# Patient Record
Sex: Female | Born: 1950 | Hispanic: No | Marital: Single | Smoking: Never smoker
Health system: Southern US, Community
[De-identification: ages and names within clinical notes are randomized; demographics above are authoritative.]

## PROBLEM LIST (undated history)

## (undated) DIAGNOSIS — I10 Essential (primary) hypertension: Secondary | ICD-10-CM

## (undated) DIAGNOSIS — I639 Cerebral infarction, unspecified: Secondary | ICD-10-CM

## (undated) DIAGNOSIS — E119 Type 2 diabetes mellitus without complications: Secondary | ICD-10-CM

## (undated) HISTORY — PX: ABDOMINAL HYSTERECTOMY: SHX81

---

## 2009-12-02 ENCOUNTER — Emergency Department (HOSPITAL_BASED_OUTPATIENT_CLINIC_OR_DEPARTMENT_OTHER): Admission: EM | Admit: 2009-12-02 | Discharge: 2009-12-02 | Payer: Self-pay | Admitting: Emergency Medicine

## 2019-04-05 ENCOUNTER — Other Ambulatory Visit: Payer: Self-pay

## 2019-04-05 ENCOUNTER — Emergency Department (HOSPITAL_BASED_OUTPATIENT_CLINIC_OR_DEPARTMENT_OTHER)
Admission: EM | Admit: 2019-04-05 | Discharge: 2019-04-06 | Disposition: A | Discharge status: Home / Self Care | Payer: Medicare HMO | Attending: Emergency Medicine | Admitting: Emergency Medicine

## 2019-04-05 ENCOUNTER — Emergency Department (HOSPITAL_BASED_OUTPATIENT_CLINIC_OR_DEPARTMENT_OTHER): Payer: Medicare HMO

## 2019-04-05 ENCOUNTER — Encounter (HOSPITAL_BASED_OUTPATIENT_CLINIC_OR_DEPARTMENT_OTHER): Payer: Self-pay

## 2019-04-05 DIAGNOSIS — Y929 Unspecified place or not applicable: Secondary | ICD-10-CM | POA: Diagnosis not present

## 2019-04-05 DIAGNOSIS — Z23 Encounter for immunization: Secondary | ICD-10-CM | POA: Insufficient documentation

## 2019-04-05 DIAGNOSIS — S0003XA Contusion of scalp, initial encounter: Secondary | ICD-10-CM | POA: Diagnosis not present

## 2019-04-05 DIAGNOSIS — S52125B Nondisplaced fracture of head of left radius, initial encounter for open fracture type I or II: Secondary | ICD-10-CM

## 2019-04-05 DIAGNOSIS — W01190A Fall on same level from slipping, tripping and stumbling with subsequent striking against furniture, initial encounter: Secondary | ICD-10-CM | POA: Insufficient documentation

## 2019-04-05 DIAGNOSIS — Y939 Activity, unspecified: Secondary | ICD-10-CM | POA: Diagnosis not present

## 2019-04-05 DIAGNOSIS — S59912A Unspecified injury of left forearm, initial encounter: Secondary | ICD-10-CM | POA: Diagnosis present

## 2019-04-05 DIAGNOSIS — Z8673 Personal history of transient ischemic attack (TIA), and cerebral infarction without residual deficits: Secondary | ICD-10-CM | POA: Insufficient documentation

## 2019-04-05 DIAGNOSIS — S51012A Laceration without foreign body of left elbow, initial encounter: Secondary | ICD-10-CM | POA: Diagnosis not present

## 2019-04-05 DIAGNOSIS — Y999 Unspecified external cause status: Secondary | ICD-10-CM | POA: Diagnosis not present

## 2019-04-05 DIAGNOSIS — I10 Essential (primary) hypertension: Secondary | ICD-10-CM | POA: Diagnosis not present

## 2019-04-05 HISTORY — DX: Essential (primary) hypertension: I10

## 2019-04-05 HISTORY — DX: Cerebral infarction, unspecified: I63.9

## 2019-04-05 MED ORDER — LIDOCAINE-EPINEPHRINE (PF) 2 %-1:200000 IJ SOLN
20.0000 mL | Freq: Once | INTRAMUSCULAR | Status: AC
  Administered 2019-04-05: 20 mL
  Filled 2019-04-05: qty 20

## 2019-04-05 MED ORDER — TETANUS-DIPHTH-ACELL PERTUSSIS 5-2.5-18.5 LF-MCG/0.5 IM SUSP
0.5000 mL | Freq: Once | INTRAMUSCULAR | Status: AC
  Administered 2019-04-05: 0.5 mL via INTRAMUSCULAR
  Filled 2019-04-05: qty 0.5

## 2019-04-05 NOTE — ED Notes (Signed)
Pt has laceration to left elbow.

## 2019-04-05 NOTE — ED Triage Notes (Addendum)
Pt states she tripped/fell ~730pm-struck left forehead area-abrasion,slight swelling/bruising noted-no LOC-pt denies she is on blood thinners-to triage in w/c-NAD

## 2019-04-06 MED ORDER — CEPHALEXIN 500 MG PO CAPS: 500.0000 mg | ORAL_CAPSULE | Freq: Four times a day (QID) | ORAL | 0 refills | Status: AC

## 2019-04-06 NOTE — Discharge Instructions (Addendum)
You were seen today after a fall.  Your head CT is negative.  Your x-rays suggest you may have a small fracture of the radial head.  You will be placed in a splint.  You need to follow-up with orthopedics.  Because of the laceration in your elbow, this will be treated as an open fracture.  If you develop fevers or note any new or worsening symptoms you should be reevaluated.

## 2019-04-06 NOTE — ED Provider Notes (Signed)
[facility] EMERGENCY DEPARTMENT Provider Note   CSN: [id number] Arrival date & time: 04/05/19  2058     History Chief Complaint  Patient presents with  . Head Injury  . Extremity Laceration    [facility] is a 68 y.o. female.  HPI     This is 68 year old female with a history of stroke with residual left-sided deficits, hypertension who presents following a fall.  Patient reports that she tripped and fell.  She reports hitting her head on a piece of furniture.  She denies loss of consciousness or syncope.  She denies significant pain and states overall she feels fine.  She is not on any blood thinners.  She noted bruising to her forehead as well as injury to the left elbow.  Denies numbness or tingling in the left elbow.  At baseline she is somewhat contractured on the left.  Denies chest pain, shortness of breath, abdominal pain, nausea, vomiting.  Past Medical History:  Diagnosis Date  . Hypertension   . Stroke [facility])     There are no problems to display for this patient.   Past Surgical History:  Procedure Laterality Date  . ABDOMINAL HYSTERECTOMY       OB History   No obstetric history on file.     No family history on file.  Social History   Tobacco Use  . Smoking status: Never Smoker  . Smokeless tobacco: Never Used  Substance Use Topics  . Alcohol use: Never  . Drug use: Never    Home Medications Prior to Admission medications   Medication Sig Start Date End Date Taking? Authorizing Provider  cephALEXin (KEFLEX) 500 MG capsule Take 1 capsule (500 mg total) by mouth 4 (four) times daily. 04/06/19   Journee Kohen, Mayer Masker, MD    Allergies    Vicodin [hydrocodone-acetaminophen]  Review of Systems   Review of Systems  Constitutional: Negative for fever.  Respiratory: Negative for shortness of breath.   Cardiovascular: Negative for chest pain.  Gastrointestinal: Negative for abdominal pain, nausea and vomiting.  Musculoskeletal:     Left elbow pain  Skin: Positive for color change and wound.  Neurological: Negative for dizziness, syncope, numbness and headaches.  All other systems reviewed and are negative.   Physical Exam Updated Vital Signs BP (!) 129/54 (BP Location: Right Arm)   Pulse 80   Temp 98.8 F (37.1 C) (Oral)   Resp 20   Ht 1.626 m (5\' 4" )   Wt 74.4 kg   SpO2 98%   BMI 28.15 kg/m   Physical Exam Vitals and nursing note reviewed.  Constitutional:      Appearance: She is well-developed. She is not ill-appearing.  HENT:     Head: Normocephalic.     Comments: Hematoma left forehead and scalp    Mouth/Throat:     Mouth: Mucous membranes are moist.  Eyes:     Extraocular Movements: Extraocular movements intact.     Pupils: Pupils are equal, round, and reactive to light.  Neck:     Comments: No midline C-spine tenderness to palpation, step-off, or deformity Cardiovascular:     Rate and Rhythm: Normal rate and regular rhythm.     Heart sounds: Normal heart sounds.  Pulmonary:     Effort: Pulmonary effort is normal. No respiratory distress.     Breath sounds: No wheezing.  Abdominal:     Palpations: Abdomen is soft.     Tenderness: There is no abdominal tenderness.  Musculoskeletal:  Cervical back: Neck supple.     Comments: Difficult range of motion of the left upper extremity secondary to contracture, no obvious deformity, 2+ radial pulse  Skin:    General: Skin is warm and dry.     Comments: 7 cm V-shaped laceration over the posterior aspect of the left elbow, bleeding controlled  Neurological:     Mental Status: She is alert and oriented to person, place, and time.     Comments: Baseline deficits noted left upper and left lower extremity, brace left lower extremity  Psychiatric:        Mood and Affect: Mood normal.     ED Results / Procedures / Treatments   Labs (all labs ordered are listed, but only abnormal results are displayed) Labs Reviewed - No data to  display  EKG None  Radiology DG Elbow Complete Left  Result Date: 04/05/2019 CLINICAL DATA:  Fall EXAM: LEFT ELBOW - COMPLETE 3+ VIEW COMPARISON:  None. FINDINGS: Suboptimal patient positioning due to inability to abduct arm for appropriate AP projection. The osseous structures appear diffusely demineralized which may limit detection of small or nondisplaced fractures. Question a sclerotic line through the radial head neck junction with cortical step-off concerning for radial head fracture. Associated elbow joint effusion with a posterior fat pad sign is noted. Circumferential swelling of the elbow is present. IMPRESSION: 1. Findings concerning for radial head fracture. Could consider cross-sectional imaging if felt to be clinically warranted. 2. Associated elbow joint effusion and soft tissue swelling. 3. Osseous demineralization and suboptimal patient positioning may limit evaluation of subtle injury. Electronically Signed   By: Kreg ShropshirePrice  DeHay M.D.   On: 04/05/2019 23:45   CT Head Wo Contrast  Result Date: 04/06/2019 CLINICAL DATA:  Trip and fall EXAM: CT HEAD WITHOUT CONTRAST TECHNIQUE: Contiguous axial images were obtained from the base of the skull through the vertex without intravenous contrast. COMPARISON:  None. FINDINGS: Brain: No evidence of acute territorial infarction, hemorrhage, hydrocephalus,extra-axial collection or mass lesion/mass effect. There is mild dilatation the ventricles and sulci consistent with age-related atrophy. Low-attenuation changes in the deep white matter consistent with small vessel ischemia. Vascular: No hyperdense vessel or unexpected calcification. Skull: The skull is intact. No fracture or focal lesion identified. Sinuses/Orbits: The visualized paranasal sinuses and mastoid air cells are clear. The orbits and globes intact. Other: A soft tissue hematoma seen over left frontal skull measuring approximately 2 cm. There is also soft tissue swelling seen over the left  temporal skull. IMPRESSION: No acute intracranial abnormality. Findings consistent with age related atrophy and chronic small vessel ischemia Soft tissue swelling and hematoma overlying the left frontotemporal skull. Electronically Signed   By: Jonna ClarkBindu  Avutu M.D.   On: 04/06/2019 00:06    Procedures .Marland Kitchen.Laceration Repair  Date/Time: 04/06/2019 1:13 AM Performed by: Shon BatonHorton, Job Holtsclaw F, MD Authorized by: Shon BatonHorton, Giles Currie F, MD   Consent:    Consent obtained:  Verbal   Consent given by:  Patient   Risks discussed:  Infection, poor cosmetic result, pain and poor wound healing   Alternatives discussed:  No treatment Anesthesia (see MAR for exact dosages):    Anesthesia method:  Local infiltration   Local anesthetic:  Lidocaine 2% WITH epi Laceration details:    Location:  Shoulder/arm   Shoulder/arm location:  L elbow   Length (cm):  7   Depth (mm):  5 Repair type:    Repair type:  Simple Pre-procedure details:    Preparation:  Patient was prepped and draped  in usual sterile fashion Exploration:    Wound exploration: wound explored through full range of motion and entire depth of wound probed and visualized     Wound extent: no foreign bodies/material noted, no muscle damage noted, no nerve damage noted, no tendon damage noted and no vascular damage noted     Contaminated: no   Treatment:    Area cleansed with:  Betadine   Amount of cleaning:  Standard   Irrigation solution:  Sterile saline   Irrigation volume:  500   Irrigation method:  Pressure wash   Visualized foreign bodies/material removed: no   Skin repair:    Repair method:  Sutures   Suture size:  4-0   Suture material:  Nylon   Suture technique:  Horizontal mattress   Number of sutures:  4 Approximation:    Approximation:  Close Post-procedure details:    Dressing:  Antibiotic ointment, sterile dressing and splint for protection   Patient tolerance of procedure:  Tolerated well, no immediate complications    (including critical care time)  Medications Ordered in ED Medications  Tdap (BOOSTRIX) injection 0.5 mL (0.5 mLs Intramuscular Given 04/05/19 2358)  lidocaine-EPINEPHrine (XYLOCAINE W/EPI) 2 %-1:200000 (PF) injection 20 mL (20 mLs Infiltration Given 04/05/19 2358)    ED Course  I have reviewed the triage vital signs and the nursing notes.  Pertinent labs & imaging results that were available during my care of the patient were reviewed by me and considered in my medical decision making (see chart for details).    MDM Rules/Calculators/A&P                       Patient presents following a fall.  She reports a mechanical fall.  Residual left-sided deficits.  She is overall nontoxic-appearing and vital signs are reassuring.  She has a hematoma to the forehead and the laceration to the left elbow.  She is difficult to examine on the left secondary to contracture of that left upper extremity.  CT head negative for acute bleed.  X-rays of the left elbow with effusion which may suggest an occult radial head fracture.  She does have a laceration over the left elbow.  It does not appear to communicate with the joint.  However, given the laceration, I did consult with orthopedics, Dr. Wynelle Link.  He agrees with plan for washout, laceration repair, splint, and close follow-up with antibiotics as an outpatient.  Will treat conservatively as an open fracture. Laceration was repaired without difficulty.  Posterior splint was placed.  Will place on Keflex and provide her information for follow-up with Dr. Wynelle Link in the office.  After history, exam, and medical workup I feel the patient has been appropriately medically screened and is safe for discharge home. Pertinent diagnoses were discussed with the patient. Patient was given return precautions.   Final Clinical Impression(s) / ED Diagnoses Final diagnoses:  Type I or II open nondisplaced fracture of head of left radius, initial encounter  Elbow  laceration, left, initial encounter  Hematoma of scalp, initial encounter    Rx / DC Orders ED Discharge Orders         Ordered    cephALEXin (KEFLEX) 500 MG capsule  4 times daily     04/06/19 0100           Anthony Roland, Barbette Hair, MD 04/06/19 [phone]

## 2019-07-13 ENCOUNTER — Other Ambulatory Visit: Payer: Self-pay

## 2019-07-13 ENCOUNTER — Emergency Department (HOSPITAL_BASED_OUTPATIENT_CLINIC_OR_DEPARTMENT_OTHER): Payer: Medicare HMO

## 2019-07-13 ENCOUNTER — Encounter (HOSPITAL_BASED_OUTPATIENT_CLINIC_OR_DEPARTMENT_OTHER): Payer: Self-pay | Admitting: Emergency Medicine

## 2019-07-13 ENCOUNTER — Emergency Department (HOSPITAL_BASED_OUTPATIENT_CLINIC_OR_DEPARTMENT_OTHER)
Admission: EM | Admit: 2019-07-13 | Discharge: 2019-07-13 | Disposition: A | Discharge status: Home / Self Care | Payer: Medicare HMO | Attending: Emergency Medicine | Admitting: Emergency Medicine

## 2019-07-13 DIAGNOSIS — Y999 Unspecified external cause status: Secondary | ICD-10-CM | POA: Insufficient documentation

## 2019-07-13 DIAGNOSIS — E119 Type 2 diabetes mellitus without complications: Secondary | ICD-10-CM | POA: Diagnosis not present

## 2019-07-13 DIAGNOSIS — Y9389 Activity, other specified: Secondary | ICD-10-CM | POA: Diagnosis not present

## 2019-07-13 DIAGNOSIS — Y92007 Garden or yard of unspecified non-institutional (private) residence as the place of occurrence of the external cause: Secondary | ICD-10-CM | POA: Insufficient documentation

## 2019-07-13 DIAGNOSIS — S0181XA Laceration without foreign body of other part of head, initial encounter: Secondary | ICD-10-CM | POA: Diagnosis not present

## 2019-07-13 DIAGNOSIS — W01198A Fall on same level from slipping, tripping and stumbling with subsequent striking against other object, initial encounter: Secondary | ICD-10-CM | POA: Insufficient documentation

## 2019-07-13 DIAGNOSIS — I10 Essential (primary) hypertension: Secondary | ICD-10-CM | POA: Diagnosis not present

## 2019-07-13 DIAGNOSIS — S060X0A Concussion without loss of consciousness, initial encounter: Secondary | ICD-10-CM | POA: Diagnosis not present

## 2019-07-13 DIAGNOSIS — S0990XA Unspecified injury of head, initial encounter: Secondary | ICD-10-CM | POA: Diagnosis present

## 2019-07-13 DIAGNOSIS — S0031XA Abrasion of nose, initial encounter: Secondary | ICD-10-CM

## 2019-07-13 HISTORY — DX: Type 2 diabetes mellitus without complications: E11.9

## 2019-07-13 MED ORDER — BACITRACIN ZINC 500 UNIT/GM EX OINT: TOPICAL_OINTMENT | Freq: Once | CUTANEOUS | Status: AC

## 2019-07-13 MED ORDER — LIDOCAINE-EPINEPHRINE (PF) 2 %-1:200000 IJ SOLN
10.0000 mL | Freq: Once | INTRAMUSCULAR | Status: AC
  Administered 2019-07-13: 10 mL
  Filled 2019-07-13: qty 10

## 2019-07-13 NOTE — ED Triage Notes (Signed)
Pt reports fall in yard onto brick 1.5 hrs pta. Laceration to left side forehead, bandage applied to nose, bleeding controlled. Pt denies loc, denies blood meds. Son in law reports mild confusion en route to ED, pt aox 4 in triage. Deficits r/t prior stroke, left side weakness

## 2019-07-13 NOTE — ED Provider Notes (Signed)
[facility] Provider Note   CSN: [id number] Arrival date & time: 07/13/19  1247     History Chief Complaint  Patient presents with  . Fall    Jasmin Patton is a 69 y.o. female.  HPI 69 year old female presents with a fall.  She was bending down in the garden and kept going down.  Did not feel dizzy or lightheaded and did not pass out.  Hit her head and face on the break.  Has nose abrasion and scalp laceration.  Did not lose consciousness.  No headache.  He is not on blood thinners.  Has chronic left-sided weakness from prior stroke.  No other injuries including no neck pain.  Son reports that the patient was transiently confused. Tdap updated in Dec 2020.   Past Medical History:  Diagnosis Date  . Diabetes mellitus without complication ([place])   . Hypertension   . Stroke [facility])     There are no problems to display for this patient.   Past Surgical History:  Procedure Laterality Date  . ABDOMINAL HYSTERECTOMY       OB History   No obstetric history on file.     History reviewed. No pertinent family history.  Social History   Tobacco Use  . Smoking status: Never Smoker  . Smokeless tobacco: Never Used  Substance Use Topics  . Alcohol use: Never  . Drug use: Never    Home Medications Prior to Admission medications   Medication Sig Start Date End Date Taking? Authorizing Provider  cephALEXin (KEFLEX) 500 MG capsule Take 1 capsule (500 mg total) by mouth 4 (four) times daily. 04/06/19   Horton, Barbette Hair, MD    Allergies    Vicodin [hydrocodone-acetaminophen]  Review of Systems   Review of Systems  Musculoskeletal: Negative for neck pain.  Skin: Positive for wound.  Neurological: Negative for syncope and headaches.  All other systems reviewed and are negative.   Physical Exam Updated Vital Signs BP (!) 116/56 (BP Location: Right Arm)   Pulse 85   Temp 98.9 F (37.2 C) (Oral)   Resp 16   Ht 5\' 4"  (1.626 m)   Wt  74.4 kg   SpO2 99%   BMI 28.15 kg/m   Physical Exam Vitals and nursing note reviewed.  Constitutional:      Appearance: She is well-developed.  HENT:     Head: Normocephalic. Laceration present.      Right Ear: External ear normal.     Left Ear: External ear normal.     Nose: Laceration present. No nasal tenderness.      Comments: No maxillofacial tenderness Eyes:     General:        Right eye: No discharge.        Left eye: No discharge.  Cardiovascular:     Rate and Rhythm: Normal rate and regular rhythm.     Heart sounds: Normal heart sounds.  Pulmonary:     Effort: Pulmonary effort is normal.     Breath sounds: Normal breath sounds.  Abdominal:     Palpations: Abdomen is soft.     Tenderness: There is no abdominal tenderness.  Skin:    General: Skin is warm and dry.  Neurological:     Mental Status: She is alert and oriented to person, place, and time.     Comments: Left arm contracture and weakness. Left leg is weak compared to right. Baseline per patient. Normal right sided strength.   Psychiatric:  Mood and Affect: Mood is not anxious.     ED Results / Procedures / Treatments   Labs (all labs ordered are listed, but only abnormal results are displayed) Labs Reviewed - No data to display  EKG None  Radiology CT Head Wo Contrast  Result Date: 07/13/2019 CLINICAL DATA:  Fall today with headache and left forehead injury. EXAM: CT HEAD WITHOUT CONTRAST TECHNIQUE: Contiguous axial images were obtained from the base of the skull through the vertex without intravenous contrast. COMPARISON:  CT head 04/05/2019.  Report from remote CT 06/04/2012. FINDINGS: Brain: There is no evidence of acute intracranial hemorrhage, mass lesion, brain edema or extra-axial fluid collection. Stable atrophy with asymmetric dilatation of the right lateral ventricle and subarachnoid spaces attributed to remote right basal ganglia hemorrhage. No hydrocephalus or midline shift. There  is no CT evidence of acute cortical infarction. Vascular: Intracranial vascular calcifications. No hyperdense vessel identified. Skull: Negative for fracture or focal lesion. Sinuses/Orbits: The visualized paranasal sinuses and mastoid air cells are clear. No orbital abnormalities are seen. Other: Soft tissue swelling and emphysema within the frontal scalp. No evidence of foreign body. IMPRESSION: 1. Frontal scalp soft tissue injury with swelling and soft tissue emphysema. 2. No acute intracranial or calvarial findings. 3. 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 Electronically Signed   By: Carey Bullocks M.D.   On: 07/13/2019 14:08    Procedures .Marland KitchenLaceration Repair  Date/Time: 07/13/2019 2:40 PM Performed by: Pricilla Loveless, MD Authorized by: Pricilla Loveless, MD   Consent:    Consent obtained:  Verbal   Consent given by:  Patient Anesthesia (see MAR for exact dosages):    Anesthesia method:  Local infiltration   Local anesthetic:  Lidocaine 2% WITH epi Laceration details:    Location:  Scalp   Scalp location:  Frontal   Length (cm):  4 Repair type:    Repair type:  Simple Pre-procedure details:    Preparation:  Patient was prepped and draped in usual sterile fashion and imaging obtained to evaluate for foreign bodies Exploration:    Contaminated: no   Treatment:    Area cleansed with:  Saline   Amount of cleaning:  Standard   Irrigation solution:  Sterile saline   Irrigation method:  Syringe Skin repair:    Repair method:  Sutures   Suture size:  5-0   Suture material:  Prolene   Suture technique:  Simple interrupted   Number of sutures:  4 Approximation:    Approximation:  Close Post-procedure details:    Dressing:  Antibiotic ointment   Patient tolerance of procedure:  Tolerated well, no immediate complications   (including critical care time)  Medications Ordered in ED Medications  lidocaine-EPINEPHrine (XYLOCAINE W/EPI) 2 %-1:200000 (PF)  injection 10 mL (10 mLs Infiltration Given 07/13/19 1415)  bacitracin ointment ( Topical Given 07/13/19 1451)    ED Course  I have reviewed the triage vital signs and the nursing notes.  Pertinent labs & imaging results that were available during my care of the patient were reviewed by me and considered in my medical decision making (see chart for details).    MDM Rules/Calculators/A&P                      Sounds like her fall was not related to syncope or new weakness. Couldn't hold herself up because of chronic deficits. Scalp/forehead lac repaired as above. Nose abrasion has some skin loss but nothing to repair. No facial  tenderness. Likely a concussion, negative head CT. Tdap up to date. D/c home with suture removal at PCP Final Clinical Impression(s) / ED Diagnoses Final diagnoses:  Laceration of forehead, initial encounter  Abrasion of nose, initial encounter  Concussion without loss of consciousness, initial encounter    Rx / DC Orders ED Discharge Orders    None       Pricilla Loveless, MD 07/13/19 1531

## 2020-11-03 IMAGING — CT CT HEAD W/O CM
4 series · 16 of 47 positions shown, 18 images · non-contrast
Comparison: CT head 04/05/2019.  Report from remote CT 06/04/2012.

CLINICAL DATA: Fall today with headache and left forehead injury.

EXAM:
CT HEAD WITHOUT CONTRAST
TECHNIQUE: Contiguous axial images were obtained from the base of the skull
through the vertex without intravenous contrast.

[Series 2: head wo · axial · 0.39mm/px · z∈[-196,-76]mm · 7 of 32 slices shown, 9 images]
[im 4/32  brain]
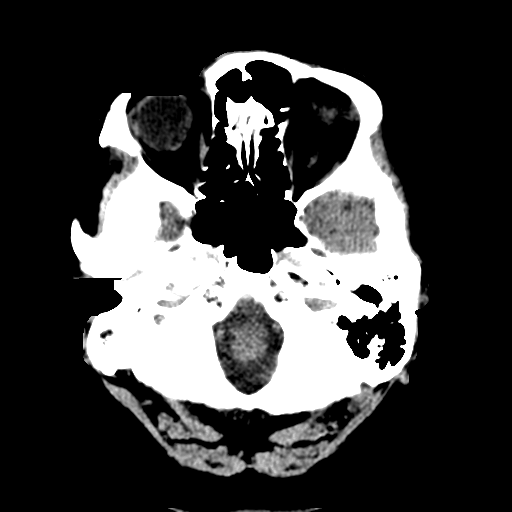
[im 4/32  bone]
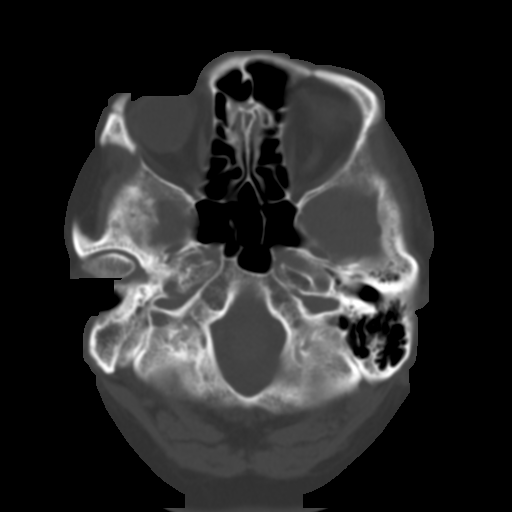
[im 8/32  brain]
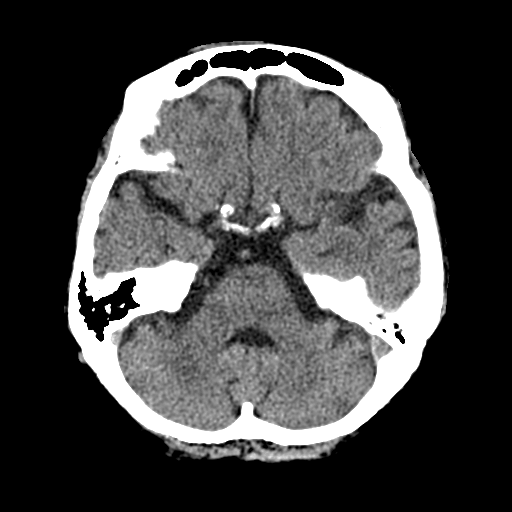
[im 12/32  brain]
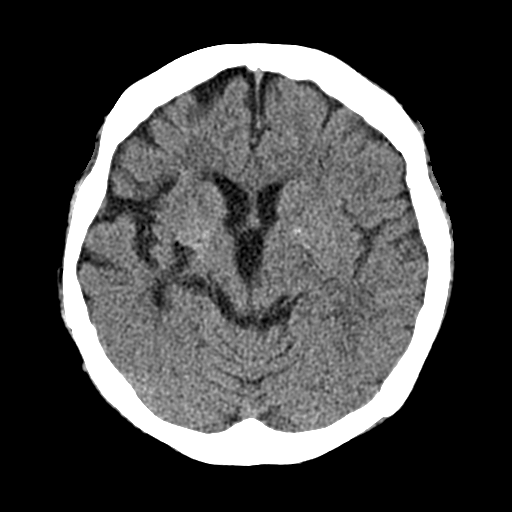
[im 16/32  brain]
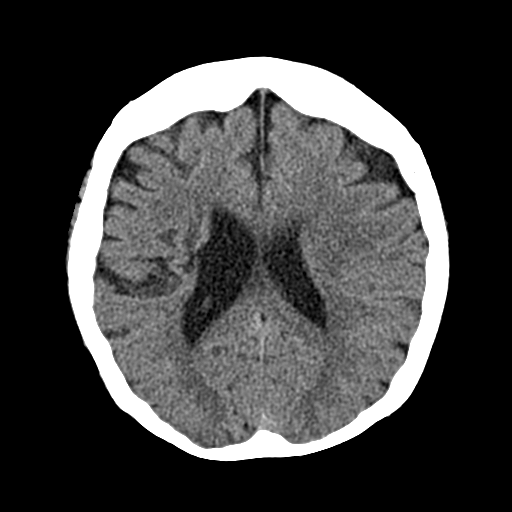
[im 20/32  brain]
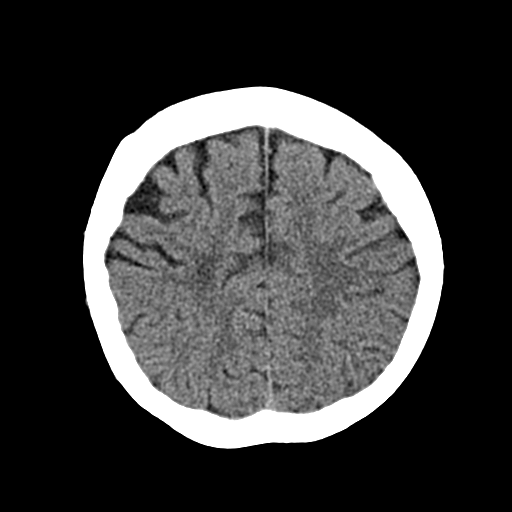
[im 20/32  bone]
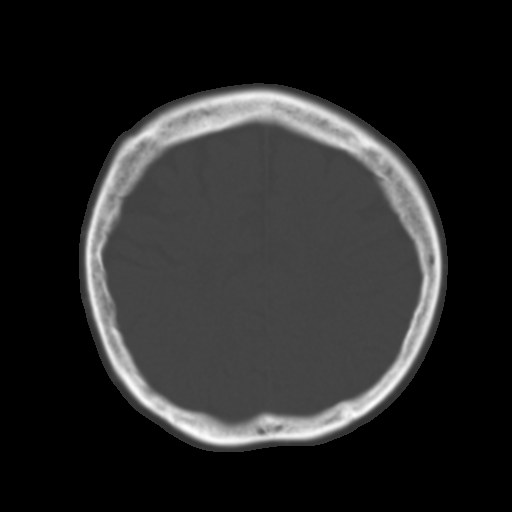
[im 24/32  brain]
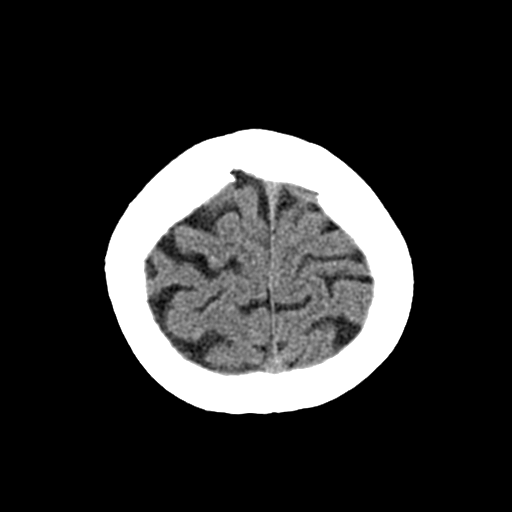
[im 28/32  brain]
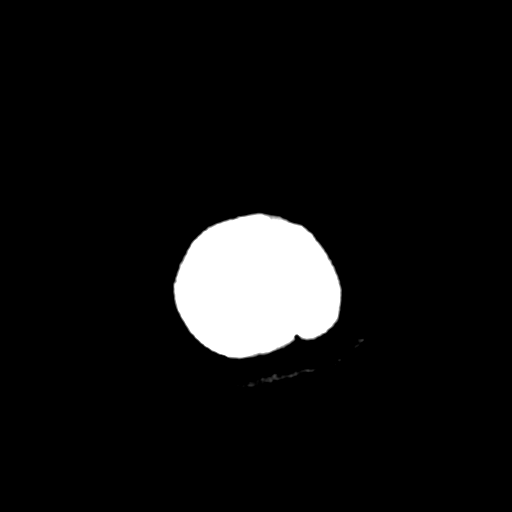

[Series 3: head bone · axial · 0.39mm/px · z∈[-197,-165]mm · 3 of 80 slices shown]
[im 8/80  bone]
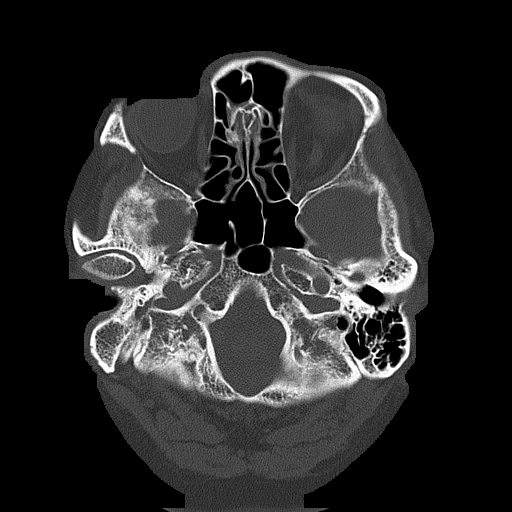
[im 16/80  bone]
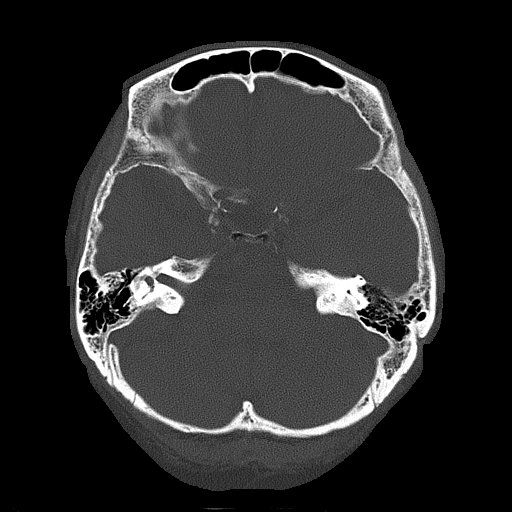
[im 24/80  bone]
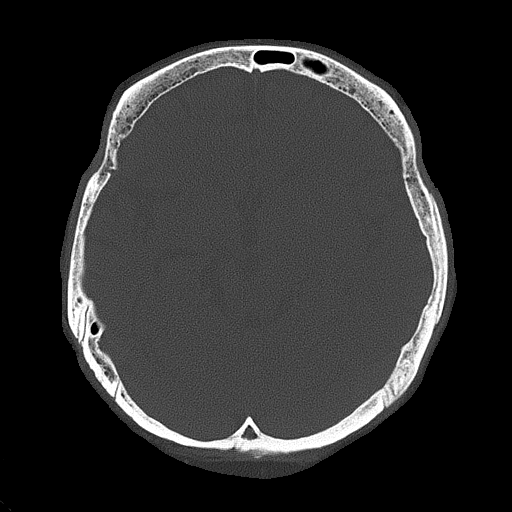

[Series 4: cor soft · coronal · 0.33mm/px · 3 of 65 slices shown]
[im 22/65  brain]
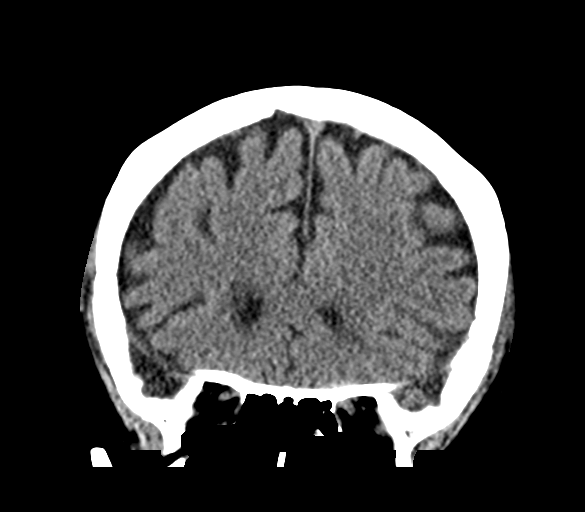
[im 29/65  brain]
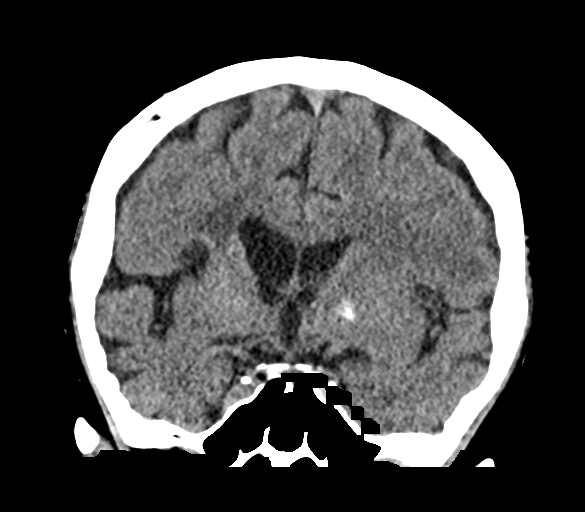
[im 36/65  brain]
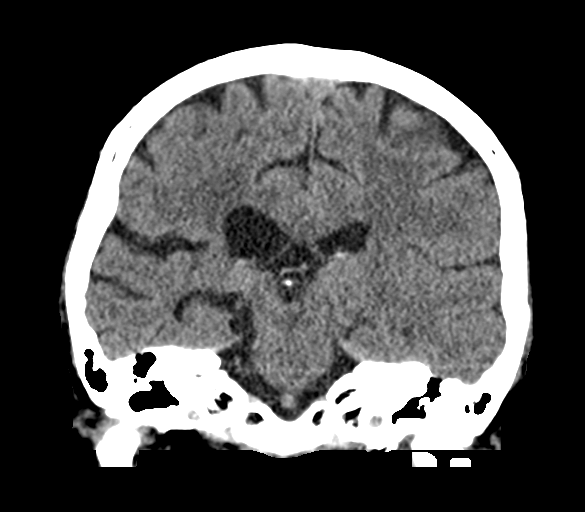

[Series 5: sag soft · sagittal · 0.31mm/px · 3 of 60 slices shown]
[im 20/60  brain]
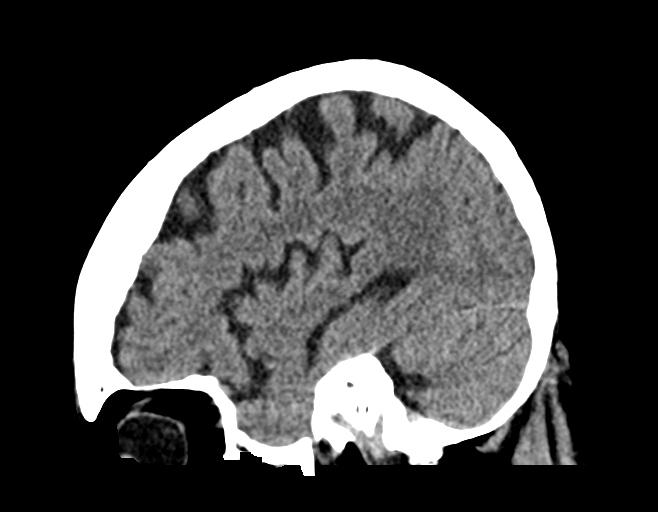
[im 30/60  brain]
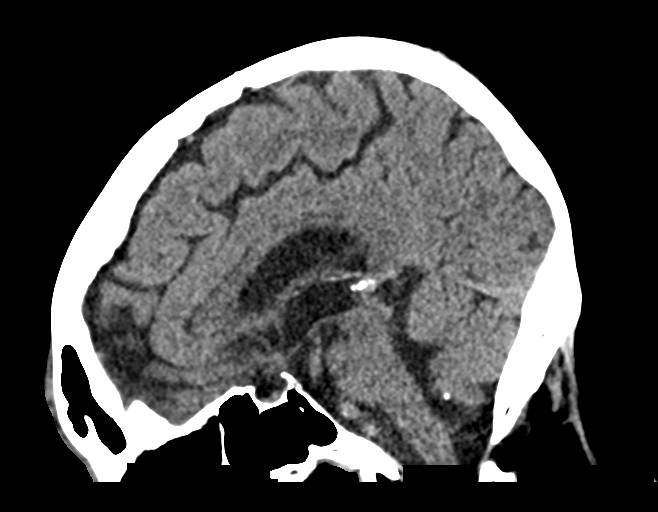
[im 40/60  brain]
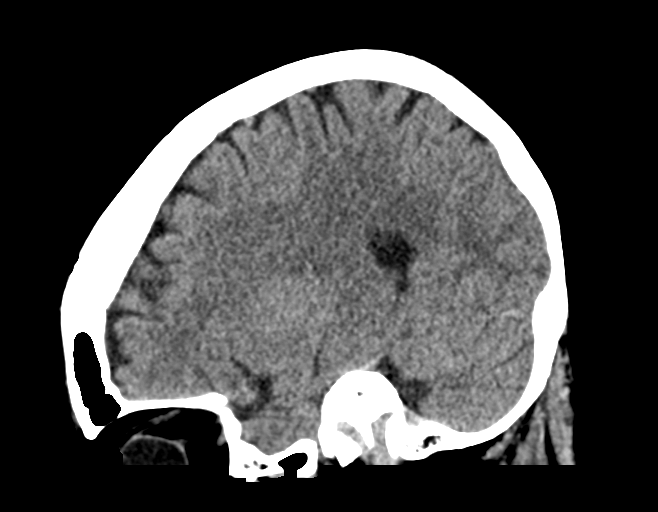

[16 of 47 positions shown; findings below may reference images not displayed]

FINDINGS: Brain: There is no evidence of acute intracranial hemorrhage, mass
lesion, brain edema or extra-axial fluid collection. Stable atrophy
with asymmetric dilatation of the right lateral ventricle and
subarachnoid spaces attributed to remote right basal ganglia
hemorrhage. No hydrocephalus or midline shift. There is no CT
evidence of acute cortical infarction.

Vascular: Intracranial vascular calcifications. No hyperdense vessel
identified.

Skull: Negative for fracture or focal lesion.

Sinuses/Orbits: The visualized paranasal sinuses and mastoid air
cells are clear. No orbital abnormalities are seen.

Other: Soft tissue swelling and emphysema within the frontal scalp.
No evidence of foreign body.
IMPRESSION: 1. Frontal scalp soft tissue injury with swelling and soft tissue
emphysema.
2. No acute intracranial or calvarial findings.
3. Stable chronic sequela of remote right basal ganglial hemorrhage.

## 2023-05-06 ENCOUNTER — Other Ambulatory Visit (HOSPITAL_COMMUNITY): Payer: Self-pay

## 2023-05-07 ENCOUNTER — Other Ambulatory Visit (HOSPITAL_COMMUNITY): Payer: Self-pay

## 2023-05-07 MED ORDER — ATORVASTATIN CALCIUM 40 MG PO TABS
40.0000 mg | ORAL_TABLET | Freq: Every day | ORAL | 2 refills | Status: AC
  Filled 2023-05-07: qty 90, 90d supply, fill #0

## 2023-05-29 ENCOUNTER — Other Ambulatory Visit (HOSPITAL_COMMUNITY): Payer: Self-pay

## 2023-07-02 ENCOUNTER — Other Ambulatory Visit (HOSPITAL_COMMUNITY): Payer: Self-pay

## 2023-07-02 ENCOUNTER — Other Ambulatory Visit: Payer: Self-pay

## 2023-07-02 MED ORDER — MAGNESIUM OXIDE -MG SUPPLEMENT 500 MG PO CAPS
500.0000 mg | ORAL_CAPSULE | Freq: Every day | ORAL | 1 refills | Status: AC
  Filled 2023-07-02: qty 90, 90d supply, fill #0

## 2023-07-02 MED ORDER — METFORMIN HCL 850 MG PO TABS
850.0000 mg | ORAL_TABLET | Freq: Two times a day (BID) | ORAL | 3 refills | Status: AC
  Filled 2023-07-02: qty 180, 90d supply, fill #0
  Filled 2023-12-27: qty 180, 90d supply, fill #1
  Filled 2024-04-26: qty 180, 90d supply, fill #2

## 2023-07-02 MED ORDER — LISINOPRIL 5 MG PO TABS
5.0000 mg | ORAL_TABLET | Freq: Every day | ORAL | 3 refills | Status: AC
  Filled 2023-07-02: qty 90, 90d supply, fill #0
  Filled 2023-12-27: qty 90, 90d supply, fill #1
  Filled 2024-04-26: qty 90, 90d supply, fill #2

## 2023-07-02 MED ORDER — ATORVASTATIN CALCIUM 40 MG PO TABS
40.0000 mg | ORAL_TABLET | Freq: Every day | ORAL | 3 refills | Status: AC
  Filled 2023-07-02: qty 90, 90d supply, fill #0

## 2023-07-03 ENCOUNTER — Other Ambulatory Visit (HOSPITAL_COMMUNITY): Payer: Self-pay

## 2023-07-03 ENCOUNTER — Other Ambulatory Visit: Payer: Self-pay

## 2023-10-25 ENCOUNTER — Other Ambulatory Visit (HOSPITAL_COMMUNITY): Payer: Self-pay

## 2023-10-25 MED ORDER — CITALOPRAM HYDROBROMIDE 20 MG PO TABS
20.0000 mg | ORAL_TABLET | Freq: Every day | ORAL | 3 refills | Status: AC
  Filled 2023-10-25 – 2023-12-27 (×2): qty 90, 90d supply, fill #0

## 2023-10-27 ENCOUNTER — Other Ambulatory Visit (HOSPITAL_COMMUNITY): Payer: Self-pay

## 2023-12-27 ENCOUNTER — Other Ambulatory Visit (HOSPITAL_COMMUNITY): Payer: Self-pay

## 2023-12-28 ENCOUNTER — Other Ambulatory Visit (HOSPITAL_COMMUNITY): Payer: Self-pay

## 2023-12-30 ENCOUNTER — Other Ambulatory Visit: Payer: Self-pay

## 2024-04-20 ENCOUNTER — Other Ambulatory Visit (HOSPITAL_COMMUNITY): Payer: Self-pay

## 2024-04-20 MED ORDER — CELECOXIB 100 MG PO CAPS
100.0000 mg | ORAL_CAPSULE | Freq: Two times a day (BID) | ORAL | 0 refills | Status: AC
  Filled 2024-04-20: qty 60, 30d supply, fill #0

## 2024-04-21 ENCOUNTER — Other Ambulatory Visit (HOSPITAL_BASED_OUTPATIENT_CLINIC_OR_DEPARTMENT_OTHER): Payer: Self-pay

## 2024-04-22 ENCOUNTER — Other Ambulatory Visit (HOSPITAL_COMMUNITY): Payer: Self-pay

## 2024-04-22 MED ORDER — PREDNISONE 10 MG PO TABS
ORAL_TABLET | ORAL | 0 refills | Status: AC
  Filled 2024-04-22: qty 30, 12d supply, fill #0

## 2024-04-22 MED ORDER — AZITHROMYCIN 250 MG PO TABS
ORAL_TABLET | ORAL | 0 refills | Status: AC
  Filled 2024-04-22: qty 6, 5d supply, fill #0

## 2024-04-26 ENCOUNTER — Other Ambulatory Visit (HOSPITAL_COMMUNITY): Payer: Self-pay
